# Patient Record
Sex: Male | Born: 1955 | Race: Black or African American | Hispanic: No | Marital: Single | Smoking: Former smoker
Health system: Southern US, Community
[De-identification: ages and names within clinical notes are randomized; demographics above are authoritative.]

## PROBLEM LIST (undated history)

## (undated) DIAGNOSIS — I10 Essential (primary) hypertension: Secondary | ICD-10-CM

## (undated) DIAGNOSIS — E785 Hyperlipidemia, unspecified: Secondary | ICD-10-CM

## (undated) DIAGNOSIS — E119 Type 2 diabetes mellitus without complications: Secondary | ICD-10-CM

## (undated) DIAGNOSIS — D649 Anemia, unspecified: Secondary | ICD-10-CM

## (undated) HISTORY — DX: Type 2 diabetes mellitus without complications: E11.9

## (undated) HISTORY — DX: Anemia, unspecified: D64.9

## (undated) HISTORY — DX: Hyperlipidemia, unspecified: E78.5

## (undated) HISTORY — DX: Essential (primary) hypertension: I10

---

## 2018-02-07 ENCOUNTER — Encounter: Payer: Self-pay | Admitting: *Deleted

## 2018-02-07 ENCOUNTER — Encounter: Payer: 59 | Attending: Family Medicine | Admitting: *Deleted

## 2018-02-07 VITALS — BP 124/82 | Ht 74.0 in | Wt 264.8 lb

## 2018-02-07 DIAGNOSIS — Z713 Dietary counseling and surveillance: Secondary | ICD-10-CM | POA: Diagnosis not present

## 2018-02-07 DIAGNOSIS — E1121 Type 2 diabetes mellitus with diabetic nephropathy: Secondary | ICD-10-CM | POA: Diagnosis present

## 2018-02-07 DIAGNOSIS — E1165 Type 2 diabetes mellitus with hyperglycemia: Secondary | ICD-10-CM

## 2018-02-07 NOTE — Progress Notes (Signed)
Diabetes Self-Management Education  Visit Type: First/Initial  Appt. Start Time: 0815 Appt. End Time: [id number]  02/07/2018  Mr. Luke Reese, identified by name and date of birth, is a 62 y.o. male with a diagnosis of Diabetes: Type 2.   ASSESSMENT  Blood pressure 124/82, height 6\' 2"  (1.88 m), weight 264 lb 12.8 oz (120.1 kg). Body mass index is 34 kg/m.  Diabetes Self-Management Education - 02/07/18 0928      Visit Information   Visit Type  First/Initial      Initial Visit   Diabetes Type  Type 2    Are you currently following a meal plan?  No    Are you taking your medications as prescribed?  Yes    Date Diagnosed  2 months ago      Health Coping   How would you rate your overall health?  Good      Psychosocial Assessment   Patient Belief/Attitude about Diabetes  Other (comment)   "I don't know yet"  He did not provide eye contact during the visit and often used only yes and no answers to questions.    Self-care barriers  None    Self-management support  Doctor's office    Patient Concerns  Nutrition/Meal planning;Medication;Glycemic Control;Weight Control;Monitoring    Special Needs  None    Preferred Learning Style  Auditory    Learning Readiness  Contemplating    How often do you need to have someone help you when you read instructions, pamphlets, or other written materials from your doctor or pharmacy?  1 - Never    What is the last grade level you completed in school?  12th      Pre-Education Assessment   Patient understands the diabetes disease and treatment process.  Needs Instruction    Patient understands incorporating nutritional management into lifestyle.  Needs Instruction    Patient undertands incorporating physical activity into lifestyle.  Needs Instruction    Patient understands using medications safely.  Needs Instruction    Patient understands monitoring blood glucose, interpreting and using results  Needs Instruction    Patient understands prevention,  detection, and treatment of acute complications.  Needs Instruction    Patient understands prevention, detection, and treatment of chronic complications.  Needs Instruction    Patient understands how to develop strategies to address psychosocial issues.  Needs Instruction    Patient understands how to develop strategies to promote health/change behavior.  Needs Instruction      Complications   Last HgB A1C per patient/outside source  13.4 %   11/15/17   How often do you check your blood sugar?  Patient declines   Pt reports she doesn't want to stick his finger. He did not want me to check his blood sugar on office meter either.    Have you had a dilated eye exam in the past 12 months?  Yes    Have you had a dental exam in the past 12 months?  No    Are you checking your feet?  No      Dietary Intake   Breakfast  skips    Lunch  skips    Snack (afternoon)  fruit    Dinner  "whatever is in [employer] caf[employer] at work" reports eating beef, pork, chicken, fish, bread, potatoes, peas, beans, corn, rice, pasta, green beans, greens, salad    Beverage(s)  water, juice, sugar sweetened tea      Exercise   Exercise Type  ADL's  Patient Education   Previous Diabetes Education  No    Disease state   Definition of diabetes, type 1 and 2, and the diagnosis of diabetes    Nutrition management   Role of diet in the treatment of diabetes and the relationship between the three main macronutrients and blood glucose level    Physical activity and exercise   Role of exercise on diabetes management, blood pressure control and cardiac health.    Medications  Reviewed patients medication for diabetes, action, purpose, timing of dose and side effects.    Monitoring  Identified appropriate SMBG and/or A1C goals.    Chronic complications  Relationship between chronic complications and blood glucose control    Psychosocial adjustment  Identified and addressed patients feelings and concerns about diabetes       Individualized Goals (developed by patient)   Reducing Risk  Improve blood sugars Decrease medications Prevent diabetes complications Lose weight Become more fit     Outcomes   Expected Outcomes  Demonstrated limited interest in learning.  Expect minimal changes    Program Status  Not Completed       Individualized Plan for Diabetes Self-Management Training:   Learning Objective:  Patient will have a greater understanding of diabetes self-management. Patient education plan is to attend individual and/or group sessions per assessed needs and concerns.   Plan:   Patient Instructions  Exercise: Begin walking for  15  minutes  3  days a week and gradually increase to 150 minutes/week Eat 3 meals day,   1-2 snacks a day Space meals 4-6 hours apart Don't skip meals Avoid sugar sweetened drinks (soda, tea, juices) Call back if you want to schedule an appointment with the dietitian or to attend Diabetes classes   Expected Outcomes:  Demonstrated limited interest in learning.  Expect minimal changes  Education material provided:  General Meal Planning Guidelines Simple Meal Plan  If problems or questions, patient to contact team via:  Sharion SettlerSheila Shotwell, RN, CCM, CDE [phone]  Future DSME appointment:  The patient doesn't want to return for any further Diabetes education at this time. He was offered classes or an individual appointment with the dietitian.

## 2018-02-07 NOTE — Patient Instructions (Signed)
Exercise: Begin walking for  15  minutes  3  days a week and gradually increase to 150 minutes/week  Eat 3 meals day,   1-2 snacks a day Space meals 4-6 hours apart Don't skip meals Avoid sugar sweetened drinks (soda, tea, juices)  Call back if you want to schedule an appointment with the dietitian or to attend Diabetes classes

## 2018-03-02 ENCOUNTER — Encounter: Payer: Self-pay | Admitting: *Deleted

## 2018-03-02 ENCOUNTER — Emergency Department: Payer: 59

## 2018-03-02 ENCOUNTER — Emergency Department
Admission: EM | Admit: 2018-03-02 | Discharge: 2018-03-02 | Disposition: A | Discharge status: Home / Self Care | Payer: 59 | Attending: Emergency Medicine | Admitting: Emergency Medicine

## 2018-03-02 ENCOUNTER — Other Ambulatory Visit: Payer: Self-pay

## 2018-03-02 DIAGNOSIS — I10 Essential (primary) hypertension: Secondary | ICD-10-CM | POA: Insufficient documentation

## 2018-03-02 DIAGNOSIS — Z7984 Long term (current) use of oral hypoglycemic drugs: Secondary | ICD-10-CM | POA: Insufficient documentation

## 2018-03-02 DIAGNOSIS — W19XXXA Unspecified fall, initial encounter: Secondary | ICD-10-CM

## 2018-03-02 DIAGNOSIS — Z87891 Personal history of nicotine dependence: Secondary | ICD-10-CM | POA: Insufficient documentation

## 2018-03-02 DIAGNOSIS — Z79899 Other long term (current) drug therapy: Secondary | ICD-10-CM | POA: Insufficient documentation

## 2018-03-02 DIAGNOSIS — Y92009 Unspecified place in unspecified non-institutional (private) residence as the place of occurrence of the external cause: Secondary | ICD-10-CM | POA: Diagnosis not present

## 2018-03-02 DIAGNOSIS — S2241XA Multiple fractures of ribs, right side, initial encounter for closed fracture: Secondary | ICD-10-CM | POA: Diagnosis not present

## 2018-03-02 DIAGNOSIS — Z7982 Long term (current) use of aspirin: Secondary | ICD-10-CM | POA: Insufficient documentation

## 2018-03-02 DIAGNOSIS — Y998 Other external cause status: Secondary | ICD-10-CM | POA: Insufficient documentation

## 2018-03-02 DIAGNOSIS — Y9389 Activity, other specified: Secondary | ICD-10-CM | POA: Insufficient documentation

## 2018-03-02 DIAGNOSIS — S299XXA Unspecified injury of thorax, initial encounter: Secondary | ICD-10-CM | POA: Diagnosis present

## 2018-03-02 DIAGNOSIS — W0110XA Fall on same level from slipping, tripping and stumbling with subsequent striking against unspecified object, initial encounter: Secondary | ICD-10-CM | POA: Diagnosis not present

## 2018-03-02 DIAGNOSIS — E119 Type 2 diabetes mellitus without complications: Secondary | ICD-10-CM | POA: Insufficient documentation

## 2018-03-02 MED ORDER — NAPROXEN 500 MG PO TABS
500.0000 mg | ORAL_TABLET | Freq: Once | ORAL | Status: AC
  Administered 2018-03-02: 500 mg via ORAL
  Filled 2018-03-02: qty 1

## 2018-03-02 MED ORDER — NAPROXEN 500 MG PO TABS: 500.0000 mg | ORAL_TABLET | Freq: Two times a day (BID) | ORAL | 1 refills | Status: AC

## 2018-03-02 MED ORDER — CYCLOBENZAPRINE HCL 5 MG PO TABS: 5.0000 mg | ORAL_TABLET | Freq: Three times a day (TID) | ORAL | 0 refills | Status: DC | PRN

## 2018-03-02 MED ORDER — HYDROCODONE-ACETAMINOPHEN 5-325 MG PO TABS: 1.0000 | ORAL_TABLET | Freq: Three times a day (TID) | ORAL | 0 refills | Status: AC | PRN

## 2018-03-02 NOTE — ED Provider Notes (Signed)
[facility] Emergency Department Provider Note ____________________________________________  Time seen: 1520  I have reviewed the triage vital signs and the nursing notes.  HISTORY  Chief Complaint  Rib Injury and Fall  HPI Luke Reese is a 62 y.o. male who presents himself to the ED for evaluation of continued rib pain following mechanical fall.  Patient lives alone, describes getting up overnight on Sunday, when he accidentally tripped over a TV stand.  He landed on his right side ribs.  He denies any head injury, loss of consciousness, or weakness.  He presents now with increasing right-sided anterior rib pain.  He reports pain is worsened with coughing, sneezing, and movement.  He denies any interim treatment or evaluation for his symptoms.  Past Medical History:  Diagnosis Date  . Anemia   . Diabetes mellitus without complication (HCC)   . Hyperlipidemia   . Hypertension     There are no active problems to display for this patient.   History reviewed. No pertinent surgical history.  Prior to Admission medications   Medication Sig Start Date End Date Taking? Authorizing Provider  amLODipine (NORVASC) 10 MG tablet Take 10 mg by mouth daily. 11/11/16   [provider]  ASPIRIN 81 PO Take 1 tablet by mouth daily. 04/03/08   [provider]  atorvastatin (LIPITOR) 40 MG tablet Take 40 mg by mouth daily. 12/27/17 12/27/18  [provider]  cyclobenzaprine (FLEXERIL) 5 MG tablet Take 1 tablet (5 mg total) by mouth 3 (three) times daily as needed for muscle spasms. 03/02/18   Elverta Dimiceli, Charlesetta Ivory, PA-C  ferrous sulfate 325 (65 FE) MG tablet Take 1 tablet by mouth 2 (two) times daily. 11/11/16   [provider]  folic acid (FOLVITE) 1 MG tablet Take 1 mg by mouth daily. 10/16/17   [provider]  HYDROcodone-acetaminophen (NORCO) 5-325 MG tablet Take 1 tablet by mouth 3 (three) times daily as needed for up to 5 days.  03/02/18 03/07/18  Haywood Meinders, Charlesetta Ivory, PA-C  lisinopril (PRINIVIL,ZESTRIL) 40 MG tablet Take 40 mg by mouth daily. 11/11/16   [provider]  metFORMIN (GLUCOPHAGE-XR) 500 MG 24 hr tablet Take 2 tablets by mouth daily. 12/27/17 12/27/18  [provider]  naproxen (NAPROSYN) 500 MG tablet Take 1 tablet (500 mg total) by mouth 2 (two) times daily with a meal. 03/02/18 04/01/18  Jareth Pardee, Charlesetta Ivory, PA-C  ranitidine (ZANTAC) 150 MG tablet Take 150 mg by mouth daily.    [provider]    Allergies Patient has no known allergies.  Family History  Adopted: Yes    Social History Social History   Tobacco Use  . Smoking status: Former Smoker    Packs/day: 1.00    Years: 20.00    Pack years: 20.00    Types: Cigarettes    Last attempt to quit: 06/21/2007    Years since quitting: 10.7  . Smokeless tobacco: Never Used  Substance Use Topics  . Alcohol use: Yes    Alcohol/week: 6.0 standard drinks    Types: 6 Shots of liquor per week  . Drug use: Not on file    Review of Systems  Constitutional: Negative for fever. Eyes: Negative for visual changes. ENT: Negative for sore throat. Cardiovascular: Negative for chest pain. Respiratory: Negative for shortness of breath. Gastrointestinal: Negative for abdominal pain, vomiting and diarrhea. Genitourinary: Negative for dysuria. Musculoskeletal: Negative for back pain.  Right chest wall pain as above. Skin: Negative for rash.  Neurological: Negative for headaches, focal weakness or numbness. ____________________________________________  PHYSICAL EXAM:  VITAL SIGNS: ED Triage Vitals  Enc Vitals Group     BP 03/02/18 1510 (!) 141/78     Pulse Rate 03/02/18 1510 (!) 103     Resp 03/02/18 1510 20     Temp 03/02/18 1510 98.2 F (36.8 C)     Temp Source 03/02/18 1510 Oral     SpO2 03/02/18 1510 97 %     Weight 03/02/18 1510 255 lb (115.7 kg)     Height 03/02/18 1510 6\' 2"  (1.88 m)     Head Circumference --       Peak Flow --      Pain Score 03/02/18 1512 10     Pain Loc --      Pain Edu? --      Excl. in GC? --     Constitutional: Alert and oriented. Well appearing and in no distress. Head: Normocephalic and atraumatic. Eyes: Conjunctivae are normal. Normal extraocular movements Cardiovascular: Normal rate, regular rhythm. Normal distal pulses. Respiratory: Normal respiratory effort. No wheezes/rales/rhonchi.  Tender to palpation over the right anterior rib cage in the midclavicular line.  There is a superficial abrasion noted in the area of maximal tenderness.  No crepitus or chest wall deformity, ecchymosis, or bruising is appreciated. Gastrointestinal: Soft and nontender. No distention. Musculoskeletal: Nontender with normal range of motion in all extremities.  Neurologic:  Normal gait without ataxia. Normal speech and language. No gross focal neurologic deficits are appreciated. Skin:  Skin is warm, dry and intact. No rash noted. ____________________________________________   RADIOLOGY  CXR  IMPRESSION: Minimally displaced acute fractures of the anterolateral right sixth and seventh ribs.  No acute cardiopulmonary disease. ____________________________________________  PROCEDURES  Procedures Naproxen 500 mg Po ____________________________________________  INITIAL IMPRESSION / ASSESSMENT AND PLAN / ED COURSE  Patient with ED evaluation of persistent right anterior chest wall pain following mechanical fall and chest contusion.  Patient's x-ray reveals to minimally displaced closed rib fractures on the right.  Patient is given instructions on the management of rib fractures.  He is also given information on the importance of pulmonary exercises.  He will be discharged with prescriptions for naproxen, Flexeril, and hydrocodone No. 15.  He is encouraged to splint during coughing sneezing spells.  He is also encouraged to apply ice and/or heat to reduce symptoms but he should  follow-up with his primary provider or return to the ED for worsening symptoms, as discussed.  I reviewed the patient's prescription history over the last 12 months in the multi-state controlled substances database(s) that includes [place], [place], [place], [place], [place], [place], [place], [place], [place], [place], and [place].  Results were notable for no current prescriptions.  ____________________________________________  FINAL CLINICAL IMPRESSION(S) / ED DIAGNOSES  Final diagnoses:  Closed fracture of multiple ribs of right side, initial encounter  Fall in home, initial encounter      Lissa HoardMenshew, Chrsitopher Wik V Bacon, PA-C 03/02/18 1858    Loleta RoseForbach, Cory, MD 03/02/18 1859

## 2018-03-02 NOTE — Discharge Instructions (Signed)
Your exam and x-rays confirm rib fractures to the 6th & 7th ribs. Take the prescription pain medicines and anti-spasm medicines as directed. Apply ice and/or moist heat to the chest wall to reduce pain. Use a pillow to "hug" as you cough and sneeze. Do daily deep breathing exercises to prevent lung field flattening. Follow-up with your provider for ongoing symptoms.

## 2018-03-02 NOTE — ED Notes (Signed)
Pt hit right ribs on tv stand when falling. Superficial scraps are noted on rt ribcage. Pt states pain increases with deep breaths and movement.

## 2018-03-02 NOTE — ED Triage Notes (Signed)
Pt reports a mechanical fall on Sunday with persistent right sided rib pains that worsen when taking deep breaths and coughing. Tenderness noted. No head trauma reported.

## 2018-03-29 ENCOUNTER — Ambulatory Visit
Admission: RE | Admit: 2018-03-29 | Discharge: 2018-03-29 | Disposition: A | Discharge status: Home / Self Care | Payer: 59 | Source: Ambulatory Visit | Attending: Family Medicine | Admitting: Family Medicine

## 2018-03-29 ENCOUNTER — Other Ambulatory Visit: Payer: Self-pay | Admitting: Family Medicine

## 2018-03-29 DIAGNOSIS — Z8781 Personal history of (healed) traumatic fracture: Secondary | ICD-10-CM | POA: Diagnosis present

## 2018-03-29 DIAGNOSIS — I517 Cardiomegaly: Secondary | ICD-10-CM | POA: Diagnosis not present

## 2018-04-06 ENCOUNTER — Telehealth: Payer: Self-pay | Admitting: *Deleted

## 2018-04-06 ENCOUNTER — Encounter: Payer: Self-pay | Admitting: *Deleted

## 2018-04-06 DIAGNOSIS — Z122 Encounter for screening for malignant neoplasm of respiratory organs: Secondary | ICD-10-CM

## 2018-04-06 NOTE — Telephone Encounter (Signed)
Received a referral for initial lung cancer screening scan.  Contacted the patient and obtained their smoking history, former smoker quit 2008 with 39pkyr history   as well as answering questions related to screening process.  Patient denies signs of lung cancer such as weight loss or hemoptysis at this time.  Patient denies comorbidity that would prevent curative treatment if lung cancer were found.  Patient is scheduled for the Shared Decision Making Visit and CT scan on 04-24-18@0930 .

## 2018-04-23 ENCOUNTER — Telehealth: Payer: Self-pay | Admitting: *Deleted

## 2018-04-23 NOTE — Telephone Encounter (Signed)
Called pt to remind him of his ldct screening tomorrow 04-24-18@0930 , voiced understanding.

## 2018-04-24 ENCOUNTER — Encounter: Payer: Self-pay | Admitting: *Deleted

## 2018-04-24 ENCOUNTER — Inpatient Hospital Stay: Payer: 59 | Attending: Nurse Practitioner | Admitting: Nurse Practitioner

## 2018-04-24 ENCOUNTER — Telehealth: Payer: Self-pay | Admitting: *Deleted

## 2018-04-24 ENCOUNTER — Ambulatory Visit
Admission: RE | Admit: 2018-04-24 | Discharge: 2018-04-24 | Disposition: A | Discharge status: Home / Self Care | Payer: 59 | Source: Ambulatory Visit | Attending: Oncology | Admitting: Oncology

## 2018-04-24 DIAGNOSIS — J432 Centrilobular emphysema: Secondary | ICD-10-CM | POA: Diagnosis not present

## 2018-04-24 DIAGNOSIS — Z87891 Personal history of nicotine dependence: Secondary | ICD-10-CM | POA: Insufficient documentation

## 2018-04-24 DIAGNOSIS — S2241XA Multiple fractures of ribs, right side, initial encounter for closed fracture: Secondary | ICD-10-CM | POA: Insufficient documentation

## 2018-04-24 DIAGNOSIS — Z122 Encounter for screening for malignant neoplasm of respiratory organs: Secondary | ICD-10-CM | POA: Diagnosis present

## 2018-04-24 DIAGNOSIS — I2510 Atherosclerotic heart disease of native coronary artery without angina pectoris: Secondary | ICD-10-CM | POA: Diagnosis not present

## 2018-04-24 DIAGNOSIS — I700 Atherosclerosis of aorta: Secondary | ICD-10-CM | POA: Insufficient documentation

## 2018-04-24 DIAGNOSIS — K760 Fatty (change of) liver, not elsewhere classified: Secondary | ICD-10-CM | POA: Insufficient documentation

## 2018-04-24 NOTE — Progress Notes (Signed)
In accordance with CMS guidelines, patient has met eligibility criteria including age, absence of signs or symptoms of lung cancer.  Social History   Tobacco Use  . Smoking status: Former Smoker    Packs/day: 1.00    Years: 39.00    Pack years: 39.00    Types: Cigarettes    Last attempt to quit: 06/21/2007    Years since quitting: 10.8  . Smokeless tobacco: Never Used  Substance Use Topics  . Alcohol use: Yes    Alcohol/week: 6.0 standard drinks    Types: 6 Shots of liquor per week  . Drug use: Not on file      A shared decision-making session was conducted prior to the performance of CT scan. This includes one or more decision aids, includes benefits and harms of screening, follow-up diagnostic testing, over-diagnosis, false positive rate, and total radiation exposure.   Counseling on the importance of adherence to annual lung cancer LDCT screening, impact of co-morbidities, and ability or willingness to undergo diagnosis and treatment is imperative for compliance of the program.   Counseling on the importance of continued smoking cessation for former smokers; the importance of smoking cessation for current smokers, and information about tobacco cessation interventions have been given to patient including [place] and [facility] quit [place] programs.   Written order for lung cancer screening with LDCT has been given to the patient and any and all questions have been answered to the best of my abilities.    Yearly follow up will be coordinated by Burgess Estelle, Thoracic Navigator.  Beckey Rutter, DNP, AGNP-C [place] [facility] [phone] (work cell) [phone] (office) 04/24/18 9:40 AM

## 2018-04-24 NOTE — Telephone Encounter (Signed)
Notified patient of LDCT lung cancer screening program results with recommendation for 12 month follow up imaging.  Also notified of incidental findings noted below and is encouraged to discuss further questions with PCP who will receive a copy of this not and/or the CT reports.  Patient verbalized understanding.     IMPRESSION: 1. Lung-RADS 2S, benign appearance or behavior. Continue annual screening with low-dose chest CT without contrast in 12 months. 2. The "S" modifier above refers to potentially clinically significant non lung cancer related findings. Specifically, there is aortic atherosclerosis, in addition to left anterior descending coronary artery disease. Please note that although the presence of coronary artery calcium documents the presence of coronary artery disease, the severity of this disease and any potential stenosis cannot be assessed on this non-gated CT examination. Assessment for potential risk factor modification, dietary therapy or pharmacologic therapy may be warranted, if clinically indicated. 3. Mild diffuse bronchial wall thickening with mild centrilobular and paraseptal emphysema; imaging findings suggestive of underlying COPD. 4. Healing nondisplaced fractures of the lateral aspects of the right sixth and seventh ribs. No associated pneumothorax. 5. Severe hepatic steatosis.  Aortic Atherosclerosis (ICD10-I70.0) and Emphysema (ICD10-J43.9).

## 2018-11-15 ENCOUNTER — Other Ambulatory Visit: Payer: Self-pay | Admitting: Family Medicine

## 2018-11-15 DIAGNOSIS — R748 Abnormal levels of other serum enzymes: Secondary | ICD-10-CM

## 2018-11-30 ENCOUNTER — Ambulatory Visit
Admission: RE | Admit: 2018-11-30 | Discharge: 2018-11-30 | Disposition: A | Discharge status: Home / Self Care | Payer: 59 | Source: Ambulatory Visit | Attending: Family Medicine | Admitting: Family Medicine

## 2018-11-30 ENCOUNTER — Other Ambulatory Visit: Payer: Self-pay

## 2018-11-30 DIAGNOSIS — R748 Abnormal levels of other serum enzymes: Secondary | ICD-10-CM

## 2018-12-04 ENCOUNTER — Other Ambulatory Visit: Payer: Self-pay | Admitting: Family Medicine

## 2018-12-05 ENCOUNTER — Other Ambulatory Visit: Payer: Self-pay | Admitting: Family Medicine

## 2018-12-05 DIAGNOSIS — R748 Abnormal levels of other serum enzymes: Secondary | ICD-10-CM

## 2019-04-26 ENCOUNTER — Telehealth: Payer: Self-pay | Admitting: *Deleted

## 2019-04-26 DIAGNOSIS — Z122 Encounter for screening for malignant neoplasm of respiratory organs: Secondary | ICD-10-CM

## 2019-04-26 DIAGNOSIS — Z87891 Personal history of nicotine dependence: Secondary | ICD-10-CM

## 2019-04-26 NOTE — Telephone Encounter (Signed)
Patient has been notified that annual lung cancer screening low dose CT scan is due currently or will be in near future. Confirmed that patient is within the age range of 55-77, and asymptomatic, (no signs or symptoms of lung cancer). Patient denies illness that would prevent curative treatment for lung cancer if found. Verified smoking history, (former, quit 2009, 39 pack year). The shared decision making visit was done 04/24/18. Patient is agreeable for CT scan being scheduled.

## 2019-05-02 ENCOUNTER — Ambulatory Visit: Payer: 59 | Attending: Nurse Practitioner

## 2019-09-13 ENCOUNTER — Telehealth: Payer: Self-pay | Admitting: *Deleted

## 2019-09-13 NOTE — Telephone Encounter (Signed)
(  09/13/19) Pt has been notified that lung cancer screening CT scan is due currently or will be in near future. Confirmed pt is within appropriate age range, and asymptomatic. Pt denies illness that would prevent curative treatment for lung cancer if found. Verified smoking history (Former Smoker since 2009, 1 ppd). Pt will receive 2nd COVID VX on (09/24/19) [CT to be scheduled approx. 4 weeks after vx date] Pt is agreeable for CT scan being scheduled, and prefers an appt on a Monday morning.  SRW

## 2019-10-07 ENCOUNTER — Telehealth: Payer: Self-pay

## 2019-10-07 NOTE — Telephone Encounter (Signed)
Called patient to confirm 2nd COVID vaccine date.  He states he did receive his vaccine but does not want to get the lung screening CT scan scheduled at this time.

## 2021-06-24 ENCOUNTER — Ambulatory Visit
Admission: RE | Admit: 2021-06-24 | Discharge: 2021-06-24 | Disposition: A | Discharge status: Home / Self Care | Payer: 59 | Source: Ambulatory Visit | Attending: Family Medicine | Admitting: Family Medicine

## 2021-06-24 ENCOUNTER — Other Ambulatory Visit: Payer: Self-pay | Admitting: Family Medicine

## 2021-06-24 ENCOUNTER — Other Ambulatory Visit: Payer: Self-pay

## 2021-06-24 DIAGNOSIS — M79604 Pain in right leg: Secondary | ICD-10-CM | POA: Diagnosis present

## 2021-06-25 ENCOUNTER — Ambulatory Visit
Admission: RE | Admit: 2021-06-25 | Discharge: 2021-06-25 | Disposition: A | Discharge status: Home / Self Care | Payer: 59 | Attending: Family Medicine | Admitting: Family Medicine

## 2021-06-25 ENCOUNTER — Other Ambulatory Visit: Payer: Self-pay | Admitting: Family Medicine

## 2021-06-25 ENCOUNTER — Ambulatory Visit
Admission: RE | Admit: 2021-06-25 | Discharge: 2021-06-25 | Disposition: A | Discharge status: Home / Self Care | Payer: 59 | Source: Ambulatory Visit | Attending: Family Medicine | Admitting: Family Medicine

## 2021-06-25 DIAGNOSIS — M79604 Pain in right leg: Secondary | ICD-10-CM

## 2022-05-25 ENCOUNTER — Ambulatory Visit: Payer: 59 | Admitting: Urology

## 2022-05-31 ENCOUNTER — Encounter: Payer: Self-pay | Admitting: Urology

## 2022-06-23 ENCOUNTER — Ambulatory Visit (INDEPENDENT_AMBULATORY_CARE_PROVIDER_SITE_OTHER): Payer: Medicare PPO | Admitting: Urology

## 2022-06-23 ENCOUNTER — Encounter: Payer: Self-pay | Admitting: Urology

## 2022-06-23 VITALS — BP 150/77 | HR 102 | Ht 74.0 in | Wt 270.0 lb

## 2022-06-23 DIAGNOSIS — N529 Male erectile dysfunction, unspecified: Secondary | ICD-10-CM

## 2022-06-23 DIAGNOSIS — Z125 Encounter for screening for malignant neoplasm of prostate: Secondary | ICD-10-CM

## 2022-06-23 MED ORDER — TADALAFIL 20 MG PO TABS: 20.0000 mg | ORAL_TABLET | ORAL | 6 refills | Status: AC

## 2022-06-23 NOTE — Progress Notes (Signed)
   06/23/22 3:04 PM   Luke Reese Nov 04, 1955 [id number]  CC: Elevated PSA, ED  HPI: 67 year old male who has been followed extensively at [facility] urology for history of mildly elevated PSA.  Most recent PSA is actually 3.44 from August 2023 which is within the normal range for his age.  PSA has been as high as 5.97 in November 2022, 5.7 in May 2018, and has ranged from 3-6.  He denies any prior prostate biopsy.  He reports erectile dysfunction that is now refractory to 5 mg Cialis on demand, he is interested in increasing that dose.  He reportedly had a normal testosterone with [facility] 1 or 2 years ago.  His past medical history is notable for type 2 diabetes(most recent hemoglobin A1c 01/2022 7.8) and hypertension  PMH: Past Medical History:  Diagnosis Date   Anemia    Diabetes mellitus without complication ([place])    Hyperlipidemia    Hypertension      Family History: Family History  Adopted: Yes    Social History:  reports that he quit smoking about 15 years ago. His smoking use included cigarettes. He has a 39.00 pack-year smoking history. He has never used smokeless tobacco. He reports current alcohol use of about 6.0 standard drinks of alcohol per week. No history on file for drug use.  Physical Exam: BP (!) 150/77 (BP Location: Left Arm, Patient Position: Sitting, Cuff Size: Large)   Pulse (!) 102   Ht 6\' 2"  (1.88 m)   Wt 270 lb (122.5 kg)   BMI 34.67 kg/m    Constitutional:  Alert and oriented, No acute distress. Cardiovascular: No clubbing, cyanosis, or edema. Respiratory: Normal respiratory effort, no increased work of breathing. GI: Abdomen is soft, nontender, nondistended, no abdominal masses   Laboratory Data: PSA history reviewed, see HPI  Pertinent Imaging: None to review  Assessment & Plan:   67 year old male with a long history of mildly elevated PSA ranging from 3-6 since 2018.  He has never undergone prostate biopsy.  PSA currently normal at 3.44.  We  reviewed the AUA guidelines regarding screening, and the variations in PSA that can be secondary to BPH, inflammation, sexual activity, etc.  He is comfortable with continuing yearly monitoring which I think is reasonable.  Regarding his erectile dysfunction, we discussed behavioral strategies including weight loss, improving control of diabetes and hypertension, as well as increasing the dose of Cialis.  I also recommended a repeat testosterone per the guideline recommendations.  -Cialis increased to 20 mg-can be taken every other day or as needed -Follow-up for morning testosterone, call with results.  If low can schedule follow-up with PA to discuss replacement options, if normal RTC 1 year with PSA prior, sooner if no improvement on the Cialis dose increase  Nickolas Madrid, MD 06/23/2022  [facility] [address], [place], [place] [address] [phone]

## 2022-06-23 NOTE — Patient Instructions (Signed)
Prostate Cancer Screening  Prostate cancer screening is testing that is done to check for the presence of prostate cancer in men. The prostate gland is a walnut-sized gland that is located below the bladder and in front of the rectum in males. The function of the prostate is to add fluid to semen during ejaculation. Prostate cancer is one of the most common types of cancer in men. Who should have prostate cancer screening? Screening recommendations vary based on age and other risk factors, as well as between the professional organizations who make the recommendations. In general, screening is recommended if: You are age 50 to 70 and have an average risk for prostate cancer. You should talk with your health care provider about your need for screening and how often screening should be done. Because most prostate cancers are slow growing and will not cause death, screening in this age group is generally reserved for men who have a 10- to 15-year life expectancy. You are younger than age 50, and you have these risk factors: Having a father, brother, or uncle who has been diagnosed with prostate cancer. The risk is higher if your family member's cancer occurred at an early age or if you have multiple family members with prostate cancer at an early age. Being a male who is Black or is of Caribbean or sub-Saharan African descent. In general, screening is not recommended if: You are younger than age 40. You are between the ages of 40 and 49 and you have no risk factors. You are 70 years of age or older. At this age, the risks that screening can cause are greater than the benefits that it may provide. If you are at high risk for prostate cancer, your health care provider may recommend that you have screenings more often or that you start screening at a younger age. How is screening for prostate cancer done? The recommended prostate cancer screening test is a blood test called the prostate-specific antigen  (PSA) test. PSA is a protein that is made in the prostate. As you age, your prostate naturally produces more PSA. Abnormally high PSA levels may be caused by: Prostate cancer. An enlarged prostate that is not caused by cancer (benign prostatic hyperplasia, or BPH). This condition is very common in older men. A prostate gland infection (prostatitis) or urinary tract infection. Certain medicines such as male hormones (like testosterone) or other medicines that raise testosterone levels. A rectal exam may be done as part of prostate cancer screening to help provide information about the size of your prostate gland. When a rectal exam is performed, it should be done after the PSA level is drawn to avoid any effect on the results. Depending on the PSA results, you may need more tests, such as: A physical exam to check the size of your prostate gland, if not done as part of screening. Blood and imaging tests. A procedure to remove tissue samples from your prostate gland for testing (biopsy). This is the only way to know for certain if you have prostate cancer. What are the benefits of prostate cancer screening? Screening can help to identify cancer at an early stage, before symptoms start and when the cancer can be treated more easily. There is a small chance that screening may lower your risk of dying from prostate cancer. The chance is small because prostate cancer is a slow-growing cancer, and most men with prostate cancer die from a different cause. What are the risks of prostate cancer screening? The   main risk of prostate cancer screening is diagnosing and treating prostate cancer that would never have caused any symptoms or problems. This is called overdiagnosisand overtreatment. PSA screening cannot tell you if your PSA is high due to cancer or a different cause. A prostate biopsy is the only procedure to diagnose prostate cancer. Even the results of a biopsy may not tell you if your cancer needs to  be treated. Slow-growing prostate cancer may not need any treatment other than monitoring, so diagnosing and treating it may cause unnecessary stress or other side effects. Questions to ask your health care provider When should I start prostate cancer screening? What is my risk for prostate cancer? How often do I need screening? What type of screening tests do I need? How do I get my test results? What do my results mean? Do I need treatment? Where to find more information The American Cancer Society: www.cancer.org American Urological Association: www.auanet.org Contact a health care provider if: You have difficulty urinating. You have pain when you urinate or ejaculate. You have blood in your urine or semen. You have pain in your back or in the area of your prostate. Summary Prostate cancer is a common type of cancer in men. The prostate gland is located below the bladder and in front of the rectum. This gland adds fluid to semen during ejaculation. Prostate cancer screening may identify cancer at an early stage, when the cancer can be treated more easily and is less likely to have spread to other areas of the body. The prostate-specific antigen (PSA) test is the recommended screening test for prostate cancer, but it has associated risks. Discuss the risks and benefits of prostate cancer screening with your health care provider. If you are age 70 or older, the risks that screening can cause are greater than the benefits that it may provide. This information is not intended to replace advice given to you by your health care provider. Make sure you discuss any questions you have with your health care provider. Document Revised: 11/30/2020 Document Reviewed: 11/30/2020 Elsevier Patient Education  2023 Elsevier Inc.  The Benefits of a Plant-Based Diet for Urology Health  A plant-based diet emphasizes the consumption of whole, unprocessed plant foods while minimizing or excluding animal  products including meat and dairy products. This dietary approach has gained attention for its potential to promote overall health, including urology-related conditions. Incorporating a plant-based diet into your lifestyle can offer numerous benefits for maintaining optimal urology health.  1. Reduced Risk of Kidney Stones: A plant-based diet is typically rich in fruits, vegetables, legumes, and whole grains. These foods are high in dietary fiber, potassium, and magnesium, which can help reduce the risk of developing kidney stones. Be careful to avoid high quantities of spinach, as these can contribute to kidney stone formation if eaten in large volumes. The increased intake of water-soluble fiber can enhance the excretion of waste products and prevent the crystallization of minerals that lead to stone formation.  2. Improved Prostate Health: Studies have suggested a link between the consumption of red and processed meats and an increased risk of prostate problems, including benign prostatic hyperplasia (BPH) and prostate cancer. By adopting a plant-based diet, you can lower your intake of saturated fats and decrease the risk of these conditions. PSA levels can often decrease on plant based diets! Plant foods are also rich in antioxidants and phytochemicals that have been associated with prostate health.  3. Better Bladder Function: A diet focused on plant-based foods   can contribute to better bladder health by reducing the risk of urinary tract infections (UTIs). Berries, citrus fruits, and leafy greens are known for their high vitamin C content, which can acidify urine and create an environment less favorable for bacteria growth. Additionally, plant-based diets are generally lower in sodium, which can help prevent fluid retention and reduce the strain on the bladder.  4. Management of Erectile Dysfunction (ED): Some research suggests that a plant-based diet can positively impact erectile function.  Plant-based diets are associated with improved cardiovascular health, which is crucial for maintaining healthy blood flow and nerve function required for proper erectile function. By reducing the consumption of high-cholesterol and high-saturated fat animal products, a plant-based diet may contribute to a decreased risk of ED.  5. Prevention of Chronic Conditions: A plant-based diet can help prevent or manage chronic conditions such as obesity, diabetes, and hypertension. These conditions can contribute to urology-related issues, including urinary incontinence and kidney dysfunction. By maintaining a healthy weight and managing these conditions, you can reduce the risk of urology-related complications.  Conclusion: Embracing a plant-based diet can offer significant benefits for urology health. By incorporating a variety of colorful fruits, vegetables, whole grains, nuts, seeds, and legumes into your meals, you can support kidney health, prostate health, bladder function, and overall well-being. Remember to consult with a healthcare professional or registered dietitian before making any significant dietary changes, especially if you have existing health conditions. Your personalized approach to a plant-based diet can contribute to improved urology health and enhance your quality of life.      

## 2022-06-28 ENCOUNTER — Other Ambulatory Visit: Payer: Medicare PPO

## 2022-06-28 DIAGNOSIS — N529 Male erectile dysfunction, unspecified: Secondary | ICD-10-CM

## 2022-06-29 LAB — TESTOSTERONE: Testosterone: 154 ng/dL — ABNORMAL LOW (ref 264–916)

## 2022-07-01 ENCOUNTER — Telehealth: Payer: Self-pay

## 2022-07-01 DIAGNOSIS — R7989 Other specified abnormal findings of blood chemistry: Secondary | ICD-10-CM

## 2022-07-01 NOTE — Telephone Encounter (Signed)
-----  Message from Billey Co, MD sent at 06/29/2022 12:45 PM EST ----- Testosterone was low at 154.  Can offer him follow-up with PA with a repeat testosterone, LH, H/H to discuss replacement options   Nickolas Madrid, MD 06/29/2022

## 2022-07-01 NOTE — Telephone Encounter (Signed)
Called pt informed him of the information below. Labs ordered, appt scheduled.

## 2022-07-05 ENCOUNTER — Other Ambulatory Visit: Payer: Medicare PPO

## 2022-07-05 DIAGNOSIS — R7989 Other specified abnormal findings of blood chemistry: Secondary | ICD-10-CM

## 2022-07-06 LAB — HEMOGLOBIN AND HEMATOCRIT, BLOOD
Hematocrit: 40 % (ref 37.5–51.0)
Hemoglobin: 13.6 g/dL (ref 13.0–17.7)

## 2022-07-06 LAB — TESTOSTERONE: Testosterone: 152 ng/dL — ABNORMAL LOW (ref 264–916)

## 2022-07-06 LAB — LUTEINIZING HORMONE: LH: 6.8 m[IU]/mL (ref 1.7–8.6)

## 2022-07-18 NOTE — Progress Notes (Deleted)
07/19/2022 9:25 PM   Glori Bickers March 15, 1956 [id number]  Referring provider: [place] [address] [place],  [place] [address]  Urological history: 1. Elevated PSA -PSA trend  (10/2016) 5.7  (04/2021) 5.97  (01/2022) 3.44  2. ED -contributing factors of age, BPH, diabetes, HTN, HLD, history of smoking, alcohol consumption and obesity -SHIM *** -tadalafil 20 mg, on-demand-dosing  3. BPH with LU TS - I PSS ***   HPI: Luke Reese is a 67 y.o. male who presents today for to discuss TRT.    AM Testosterone level (06/2022) 152 AM Testosterone level (06/2022) 154 LH (06/2022) 6.8 Hemoglobin/hematocrit (06/2022) 13.6/40.0  He is having issues with ED, ***    Score:  1-7 Mild 8-19 Moderate 20-35 Severe      Score: 1-7 Severe ED 8-11 Moderate ED 12-16 Mild-Moderate ED 17-21 Mild ED 22-25 No ED   PMH: Past Medical History:  Diagnosis Date   Anemia    Diabetes mellitus without complication ([place])    Hyperlipidemia    Hypertension     Surgical History: No past surgical history on file.  Home Medications:  Allergies as of 07/19/2022   No Known Allergies      Medication List        Accurate as of July 18, 2022  9:25 PM. If you have any questions, ask your nurse or doctor.          amLODipine 10 MG tablet Commonly known as: NORVASC Take 10 mg by mouth daily.   ASPIRIN 81 PO Take 1 tablet by mouth daily.   atorvastatin 40 MG tablet Commonly known as: LIPITOR Take 40 mg by mouth daily.   esomeprazole 20 MG capsule Commonly known as: NEXIUM Take 20 mg by mouth daily.   ferrous sulfate 325 (65 FE) MG tablet Take 1 tablet by mouth 2 (two) times daily.   folic acid 1 MG tablet Commonly known as: FOLVITE Take 1 mg by mouth daily.   glipiZIDE 5 MG 24 hr tablet Commonly known as: GLUCOTROL XL Take 5 mg by mouth daily.   Jardiance 10 MG Tabs tablet Generic drug: empagliflozin Take 10 mg by mouth daily.    lisinopril 40 MG tablet Commonly known as: ZESTRIL Take 40 mg by mouth daily.   metFORMIN 500 MG 24 hr tablet Commonly known as: GLUCOPHAGE-XR Take 2 tablets by mouth daily.   tadalafil 5 MG tablet Commonly known as: CIALIS Take 5 mg by mouth daily.   tadalafil 20 MG tablet Commonly known as: CIALIS Take 1 tablet (20 mg total) by mouth every other day.        Allergies: No Known Allergies  Family History: Family History  Adopted: Yes    Social History:  reports that he quit smoking about 15 years ago. His smoking use included cigarettes. He has a 39.00 pack-year smoking history. He has never used smokeless tobacco. He reports current alcohol use of about 6.0 standard drinks of alcohol per week. No history on file for drug use.  ROS: Pertinent ROS in HPI  Physical Exam: There were no vitals taken for this visit.  Constitutional:  Well nourished. Alert and oriented, No acute distress. HEENT: [place] AT, moist mucus membranes.  Trachea midline Cardiovascular: No clubbing, cyanosis, or edema. Respiratory: Normal respiratory effort, no increased work of breathing. Neurologic: Grossly intact, no focal deficits, moving all 4 extremities. Psychiatric: Normal mood and affect.  Laboratory Data: Lab Results  Component Value Date   HGB  13.6 07/05/2022   HCT 40.0 07/05/2022    Lab Results  Component Value Date   TESTOSTERONE 152 (L) 07/05/2022   Hemoglobin A1c (04/2022) 6.2 Total cholesterol (01/2022) 113 Serum creatinine (01/2022) 1.1 PSA (01/2022) 3.44 I have reviewed the labs.   Pertinent Imaging: N/A  Assessment & Plan:  ***  1. Testosterone deficiency -Significant symptoms*** -Recommend starting TRT*** -We discussed the most common forms of replacement including intramuscular injection and gels and he desires to start injections*** -Rx testosterone cypionate-200 mg every 2 weeks to start*** -Appointment will be made for injection training*** -Follow-up 6  weeks after starting TRT for testosterone level and symptom check -Potential side effects of testosterone replacement were discussed including stimulation of benign prostatic growth with lower urinary tract symptoms; erythrocytosis; edema; gynecomastia; worsening sleep apnea; venous thromboembolism; testicular atrophy and infertility. Recent studies suggesting an increased incidence of heart attack and stroke in patients taking testosterone was discussed. He was informed there is conflicting evidence regarding the impact of testosterone therapy on cardiovascular risk. The theoretical risk of growth stimulation of an undetected prostate cancer was also discussed.  He was informed that current evidence does not provide any definitive answers regarding the risks of testosterone therapy on prostate cancer and cardiovascular disease. The need for periodic monitoring of his testosterone level, PSA, hematocrit and DRE was discussed.      No follow-ups on file.  These notes generated with voice recognition software. I apologize for typographical errors.  [place], [place] [address]  [place], [place] [address] [phone]

## 2022-07-19 ENCOUNTER — Ambulatory Visit: Payer: Medicare PPO | Admitting: Urology

## 2022-07-19 DIAGNOSIS — E349 Endocrine disorder, unspecified: Secondary | ICD-10-CM

## 2022-11-04 ENCOUNTER — Other Ambulatory Visit: Payer: Self-pay | Admitting: Family Medicine

## 2022-11-04 DIAGNOSIS — R7989 Other specified abnormal findings of blood chemistry: Secondary | ICD-10-CM

## 2022-11-17 ENCOUNTER — Ambulatory Visit: Payer: Medicare PPO | Attending: Family Medicine

## 2023-09-22 IMAGING — US US EXTREM LOW VENOUS*R*
1 series · 14 of 24 positions shown · non-contrast
Comparison: None.

CLINICAL DATA: Right leg pain for 13 days, swelling

EXAM:
RIGHT LOWER EXTREMITY VENOUS DOPPLER ULTRASOUND
TECHNIQUE: Gray-scale sonography with compression, as well as color and duplex
ultrasound, were performed to evaluate the deep venous system(s)
from the level of the common femoral vein through the popliteal and
proximal calf veins.

[Series 1: us extrem low venous*right* · 0.08mm/px · 14 of 32 slices shown]
[im 1/32]
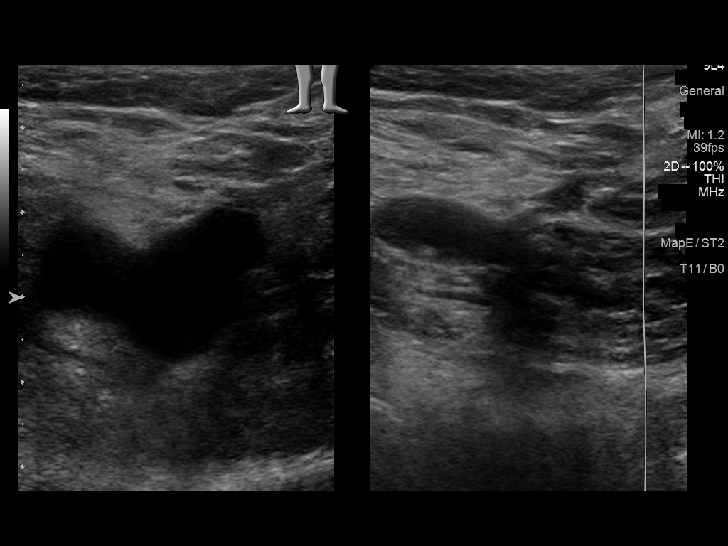
[im 3/32]
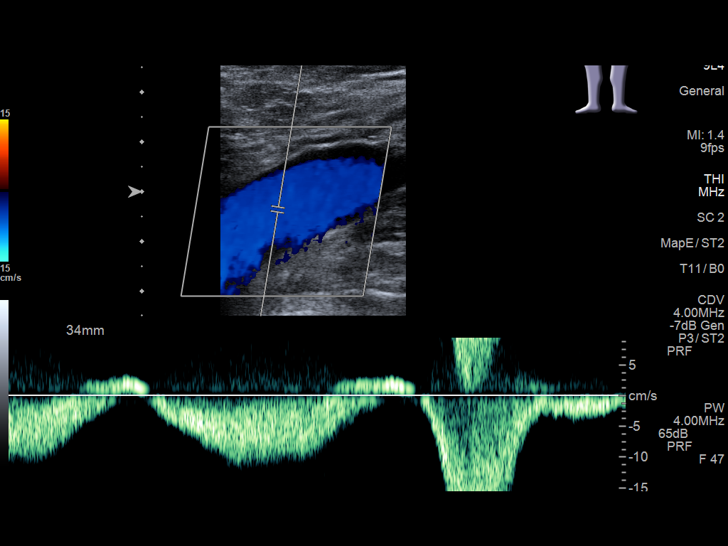
[im 6/32]
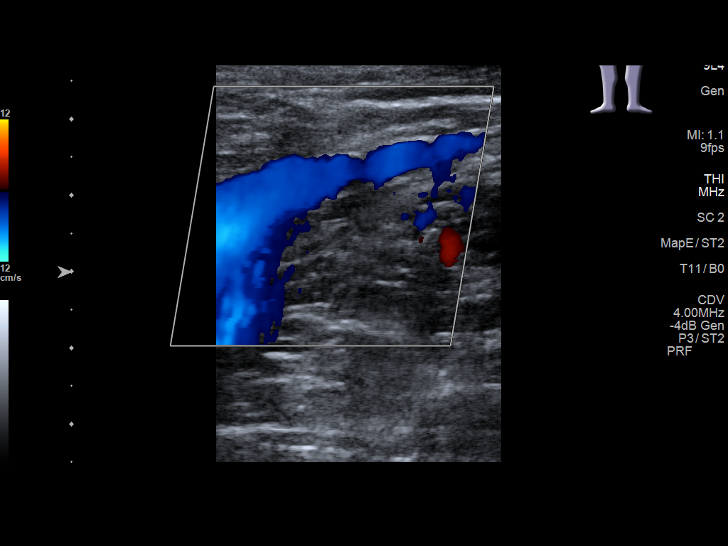
[im 9/32]
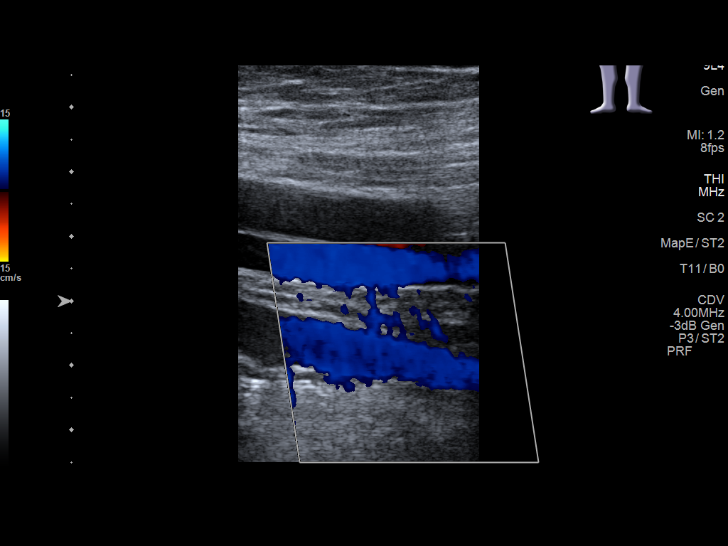
[im 10/32]
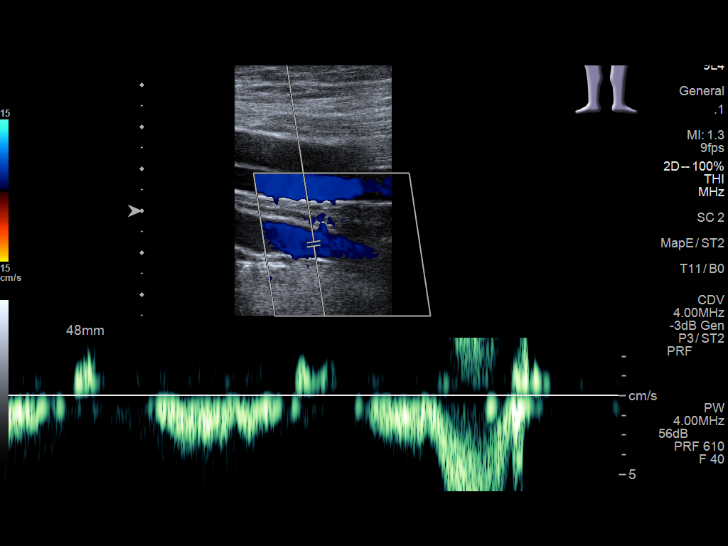
[im 13/32]
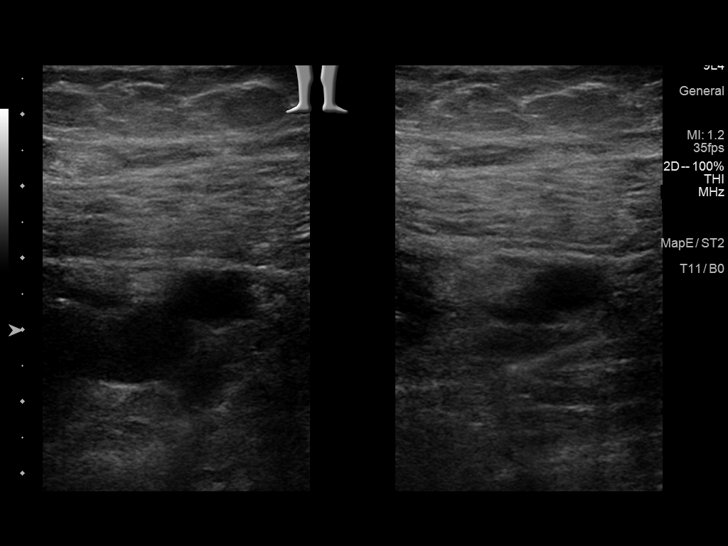
[im 15/32]
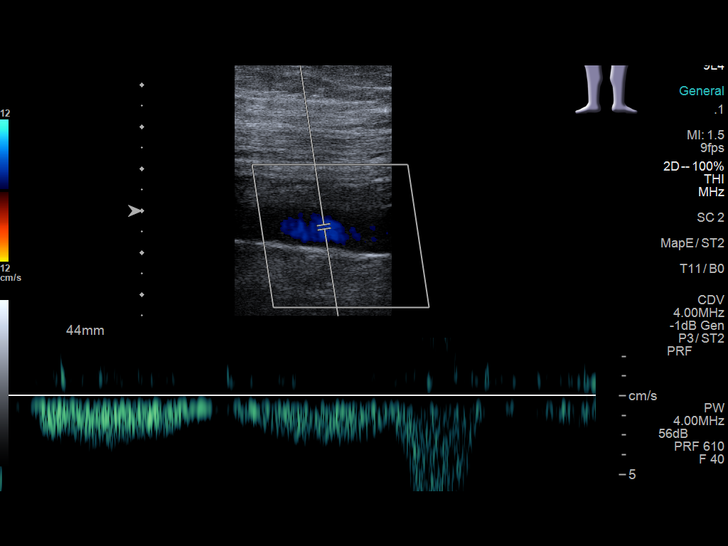
[im 17/32]
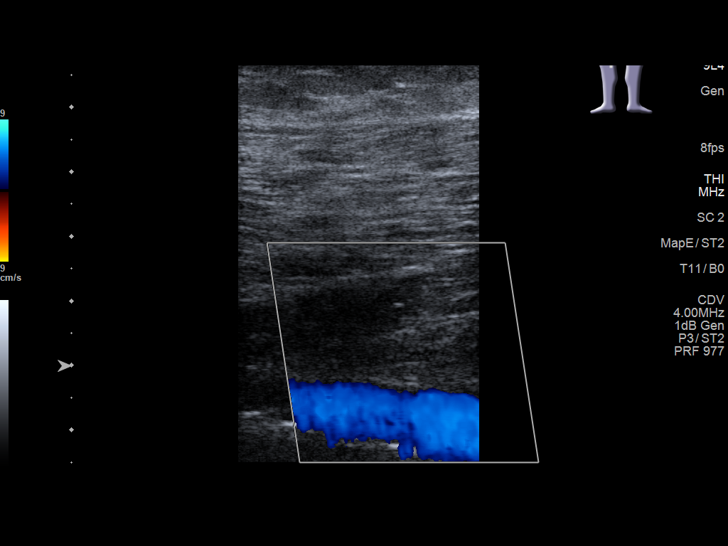
[im 19/32]
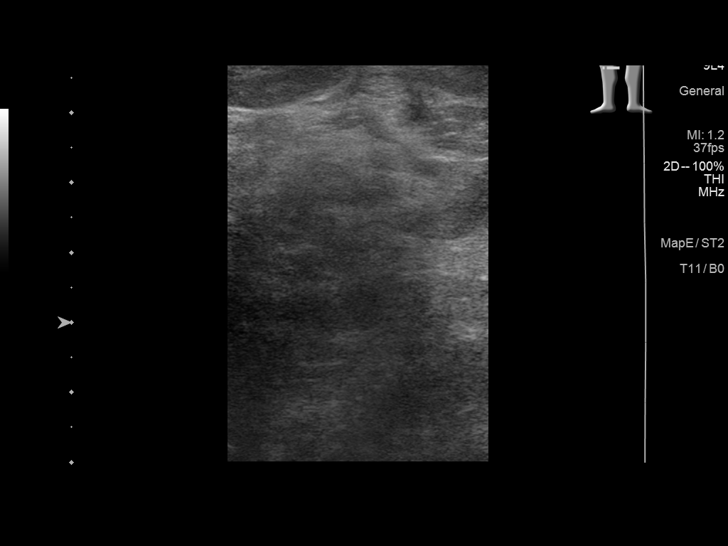
[im 22/32]
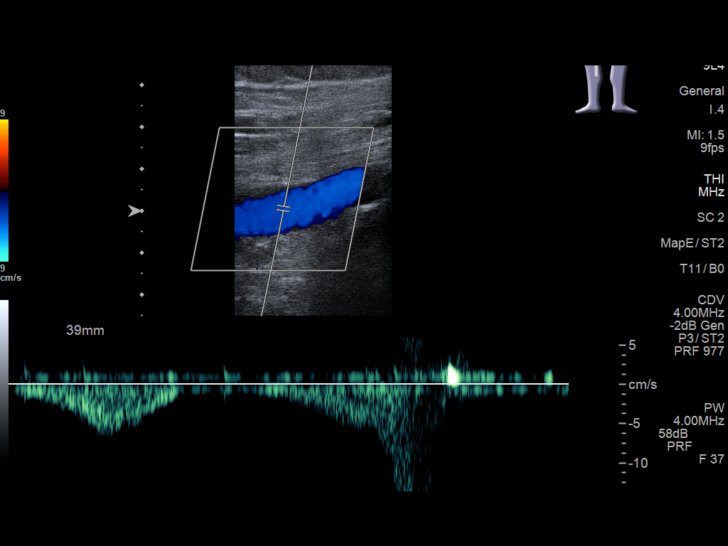
[im 25/32]
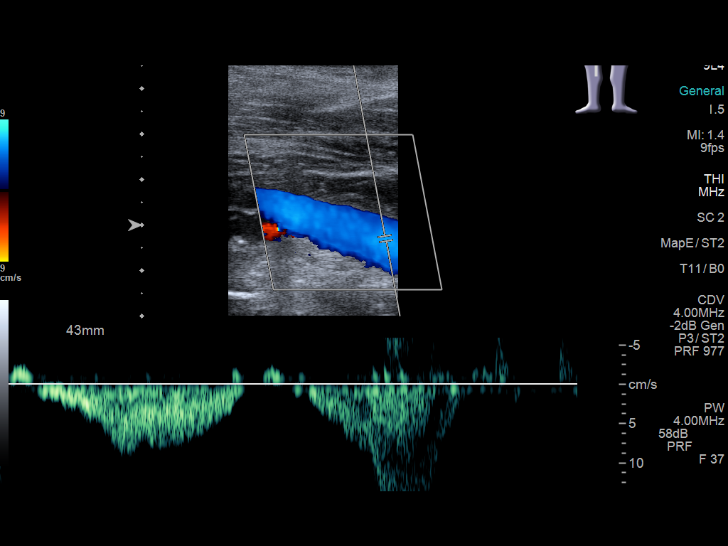
[im 26/32]
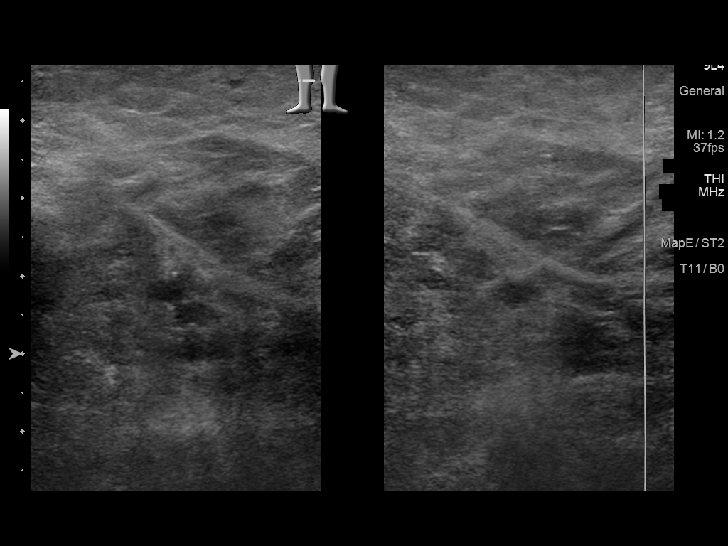
[im 29/32]
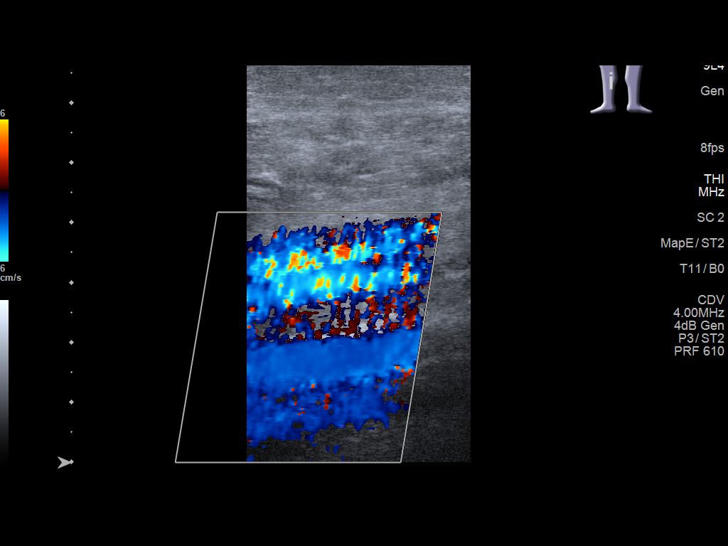
[im 32/32]
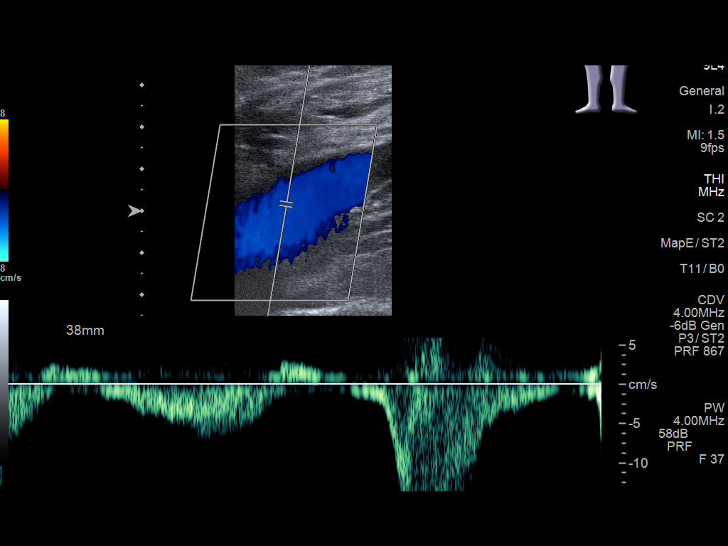

[14 of 24 positions shown; findings below may reference images not displayed]

FINDINGS: VENOUS

Normal compressibility of the common femoral, superficial femoral,
and popliteal veins, as well as the visualized calf veins.
Visualized portions of profunda femoral vein and great saphenous
vein unremarkable. No filling defects to suggest DVT on grayscale or
color Doppler imaging. Doppler waveforms show normal direction of
venous flow, normal respiratory plasticity and response to
augmentation.

Limited views of the contralateral common femoral vein are
unremarkable.

OTHER

None.

Limitations: none
IMPRESSION: 1. No evidence of deep venous thrombosis within the right lower
extremity.

## 2023-09-23 IMAGING — CR DG KNEE 3 VIEWS*R*
3 series · 3 of 3 positions shown · non-contrast
Comparison: None.

CLINICAL DATA: Right knee pain for 2 weeks.  Denies injury.

EXAM:
RIGHT KNEE - 3 VIEW

[knee ap]
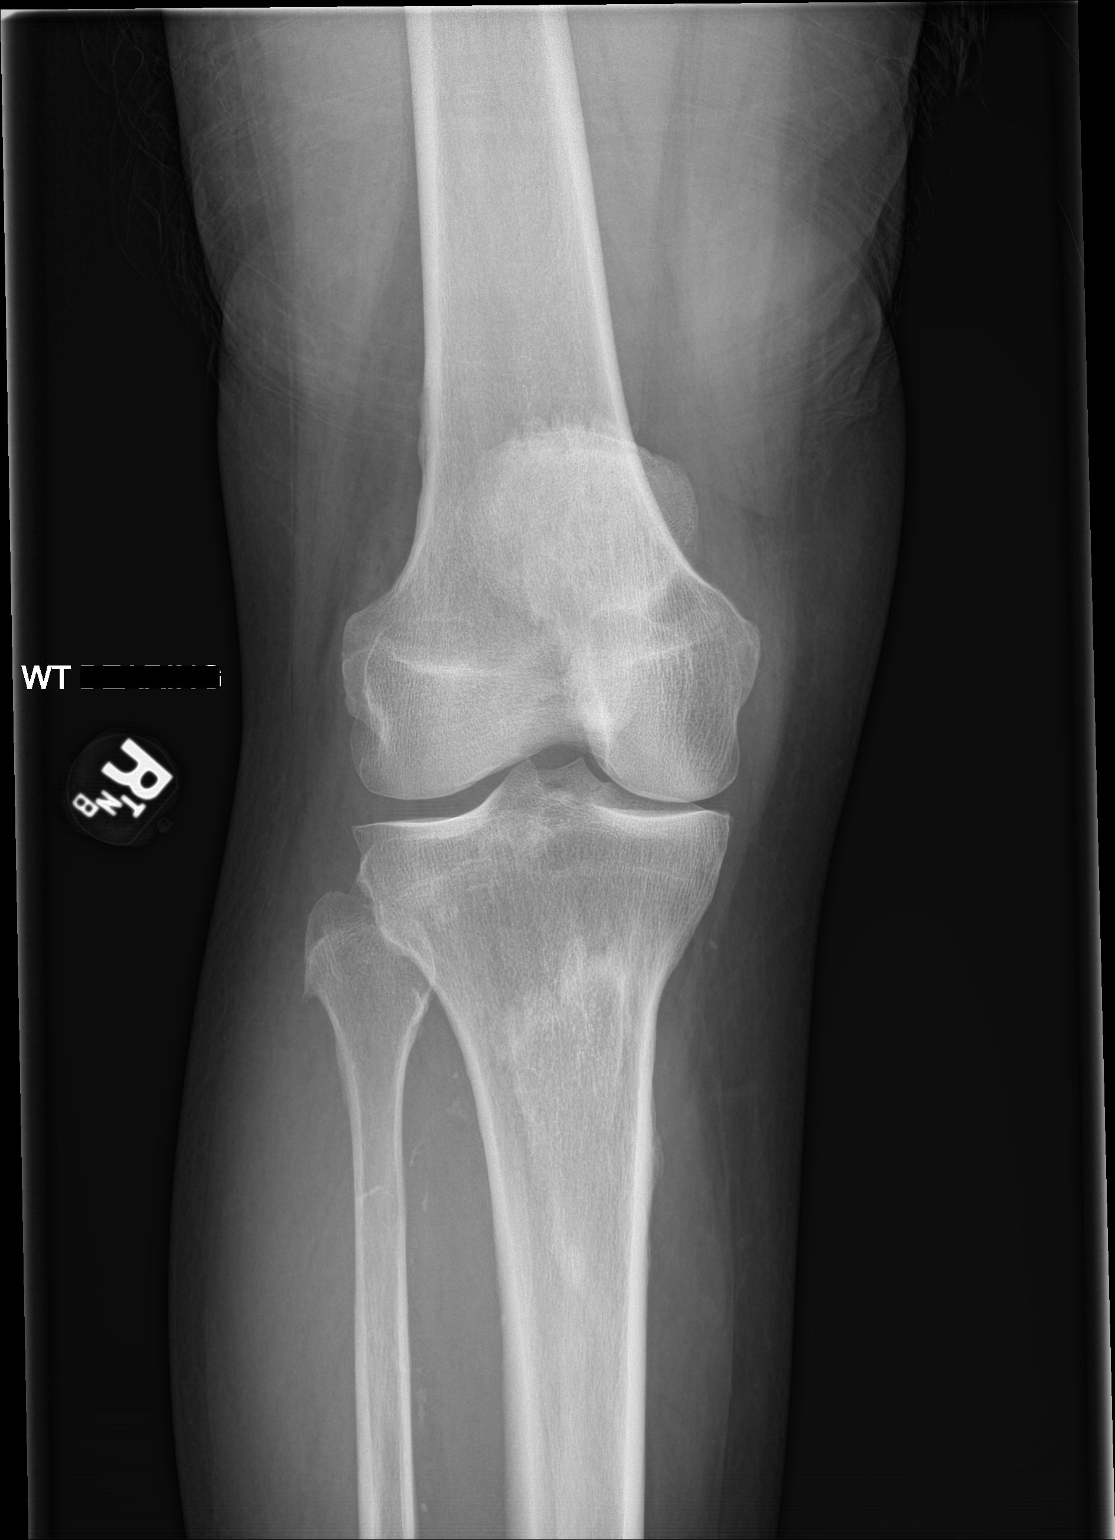

[knee obl]
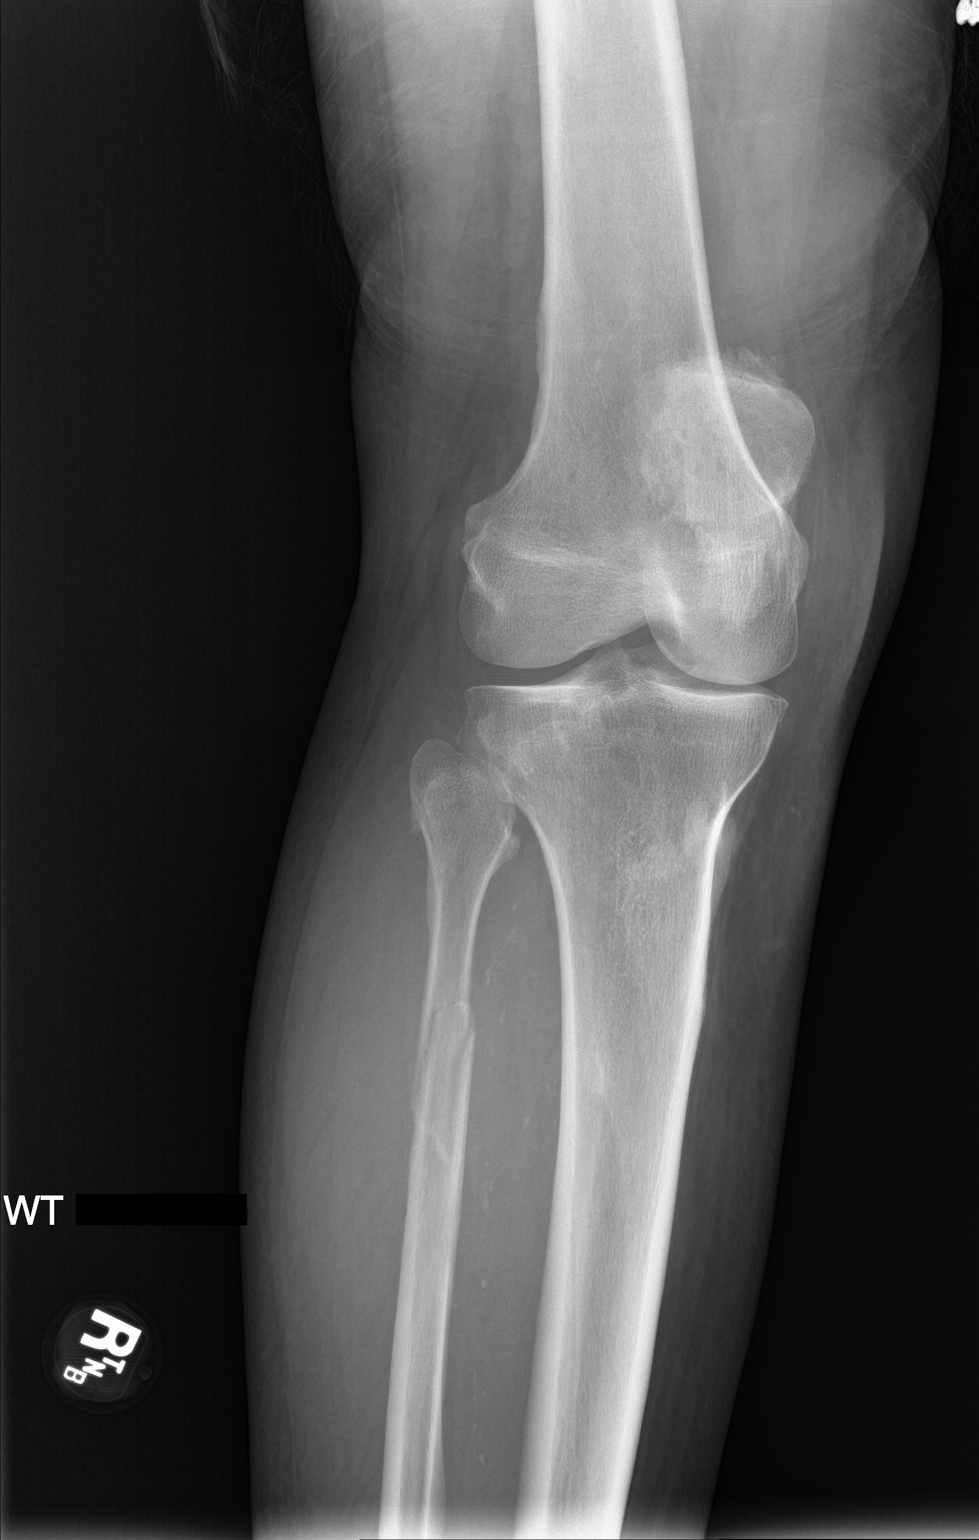

[knee lat]
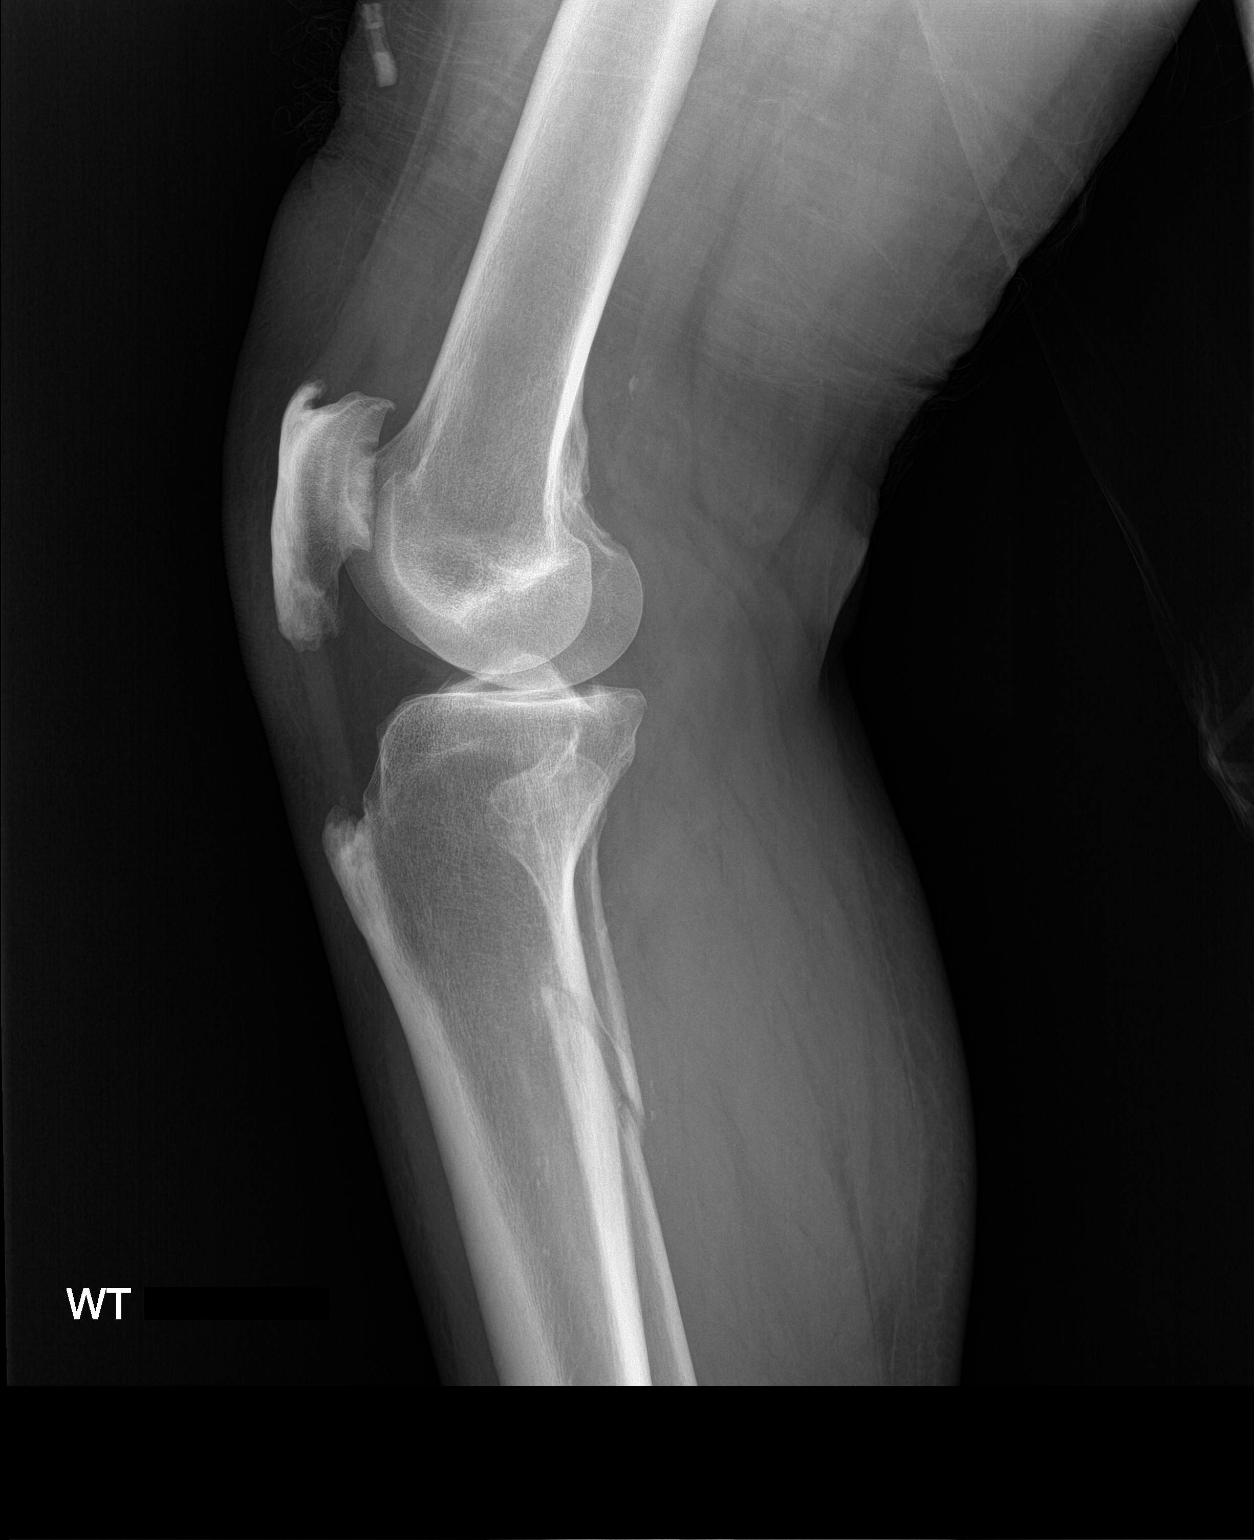

[3 of 3 positions shown; findings below may reference images not displayed]

FINDINGS: There is an oblique fracture of the proximal fibular diaphysis with
4 mm anterior cortical step-off of the distal component with respect
to the proximal component. There is sclerosis overlying the proximal
anterior tibia on frontal view which appears to correspond to
chronic thickening/spurring of the tibial tubercle on lateral view,
likely the sequela of mild-to-moderate remote Osgood-Schlatter
disease. No adjacent soft tissue swelling. There is at least
moderate patellofemoral joint space narrowing with large inferior
and moderate superior patellar degenerative spurs. No joint
effusion. Likely degenerative subchondral cysts within the patella
seen on lateral view.
IMPRESSION: Minimally displaced acute fracture of the proximal fibular
diaphysis.

Sequela of remote Osgood-Schlatter disease. Moderate to severe
patellofemoral osteoarthritis.
